# Patient Record
Sex: Female | Born: 1945 | Race: White | Hispanic: No | Marital: Married
Health system: Southern US, Community
[De-identification: ages and names within clinical notes are randomized; demographics above are authoritative.]

---

## 2016-08-26 ENCOUNTER — Other Ambulatory Visit: Payer: Self-pay | Admitting: Otolaryngology

## 2016-08-26 DIAGNOSIS — D37030 Neoplasm of uncertain behavior of the parotid salivary glands: Secondary | ICD-10-CM

## 2017-01-01 ENCOUNTER — Ambulatory Visit
Admission: RE | Admit: 2017-01-01 | Discharge: 2017-01-01 | Disposition: A | Discharge status: Home / Self Care | Payer: Medicare Other | Source: Ambulatory Visit | Attending: Otolaryngology | Admitting: Otolaryngology

## 2017-01-01 DIAGNOSIS — D37030 Neoplasm of uncertain behavior of the parotid salivary glands: Secondary | ICD-10-CM

## 2017-01-01 MED ORDER — IOPAMIDOL (ISOVUE-300) INJECTION 61%
75.0000 mL | Freq: Once | INTRAVENOUS | Status: AC | PRN
  Administered 2017-01-01: 75 mL via INTRAVENOUS

## 2018-05-30 IMAGING — CT CT NECK W/ CM
3 of 6 series · 9 of 20 positions shown, 10 images · IV contrast (75CC ISOVUE 300)
Comparison: None.

CLINICAL DATA: Neoplasm of uncertain behavior of the parotid gland.
Probable nodule anterior to the external auditory canal.

EXAM:
CT NECK WITH CONTRAST
TECHNIQUE: Multidetector CT imaging of the neck was performed using the
standard protocol following the bolus administration of intravenous
contrast.
CONTRAST:  75mL VOLXDJ-644 IOPAMIDOL (VOLXDJ-644) INJECTION 61%

[Series 3: axial neck · axial · 0.49mm/px · z∈[-236,-111]mm · 3 of 100 slices shown]
[im 25/100  bone]
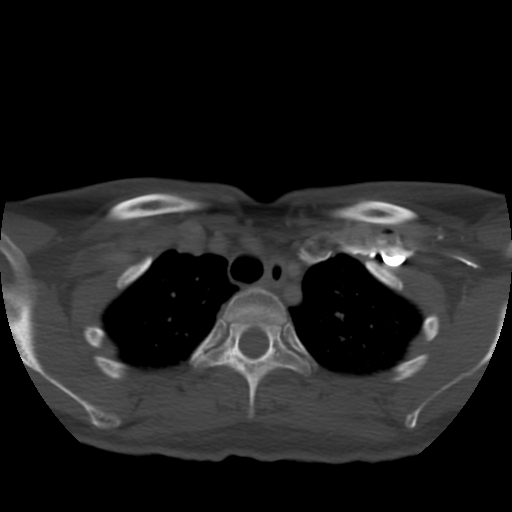
[im 50/100  bone]
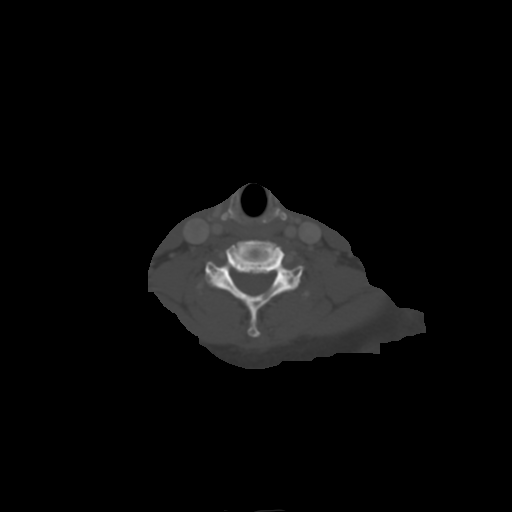
[im 75/100  bone]
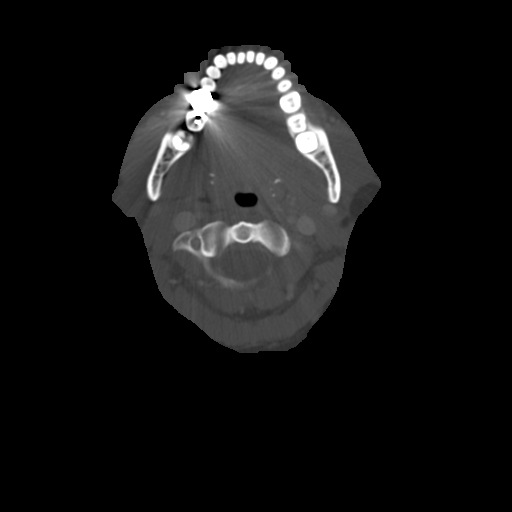

[Series 200: cor · coronal · 0.49mm/px · 3 of 67 slices shown]
[im 27/67  bone]
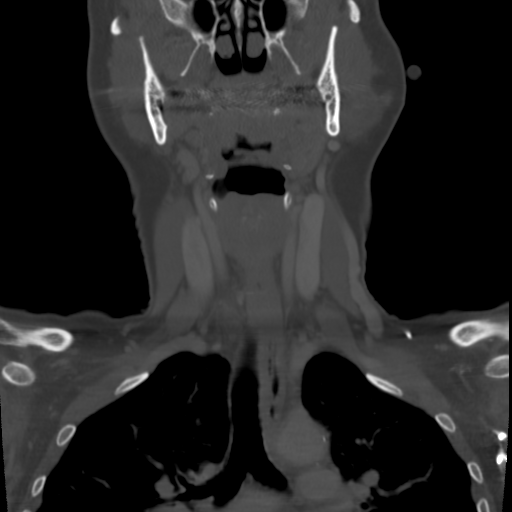
[im 37/67  bone]
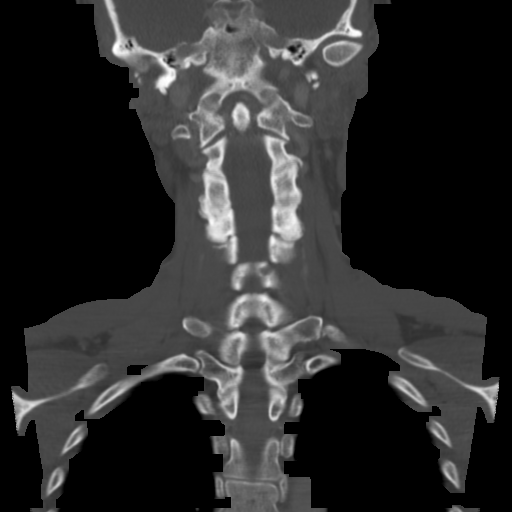
[im 47/67  bone]
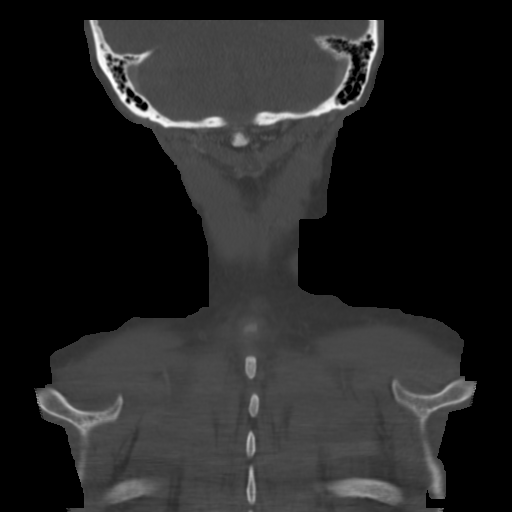

[Series 201: angled axial · axial · 0.49mm/px · z∈[-275,-142]mm · 3 of 92 slices shown, 4 images]
[im 23/92  soft-tissue]
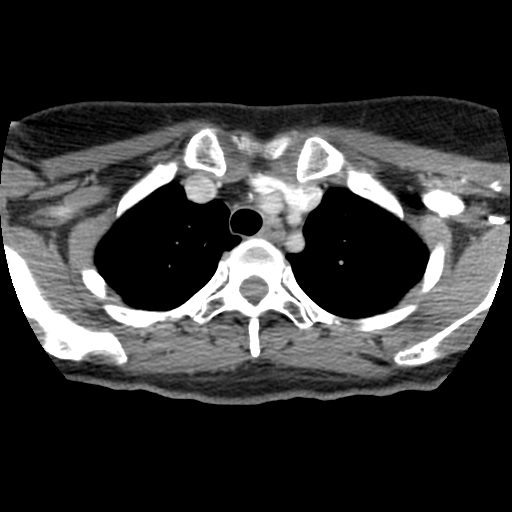
[im 23/92  bone]
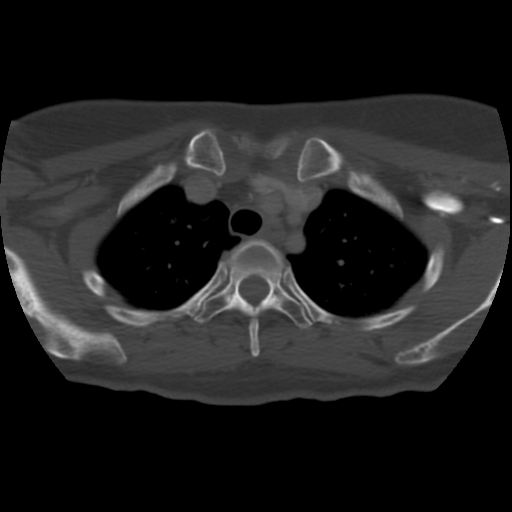
[im 46/92  bone]
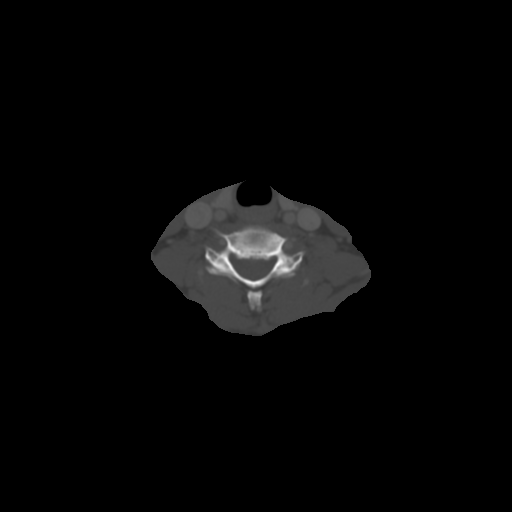
[im 69/92  bone]
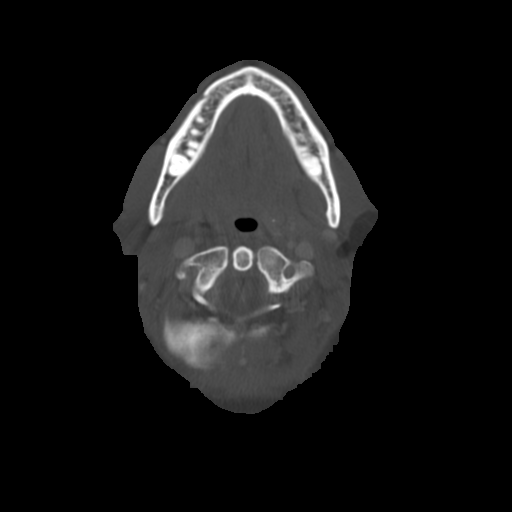

[9 of 20 positions shown; findings below may reference images not displayed]

FINDINGS: Pharynx and larynx: No focal mucosal or submucosal lesions are
present. Calcifications are present within the palatine tonsils
bilaterally. The nasopharynx, oropharynx, and hypopharynx are clear.
Vocal cords are midline and symmetric.

Salivary glands: A 2.4 x 4.0 x 2.5 cm well-defined fat-density
lesion appears to be contained within the superior aspect of the
left parotid gland. There is no discrete enhancing component. The
more inferior parotid gland is normal. The right parotid gland is
within normal limits. The submandibular glands are normal
bilaterally. There is no duct obstruction or stone.

Thyroid: Normal

Lymph nodes: No significant cervical adenopathy is present.

Vascular: No significant vascular lesions are present.

Limited intracranial: Within normal limits.

Visualized orbits: Unremarkable.

Mastoids and visualized paranasal sinuses: The paranasal sinuses and
mastoid air cells are clear.

Skeleton: Mild degenerative changes of the cervical spine are most
pronounced C5-6 with slight retrolisthesis, uncovertebral spurring,
and osseous foraminal narrowing bilaterally. No focal lytic or
blastic lesions are present otherwise.

Upper chest: The lung apices are clear bilaterally. The thoracic
inlet is within normal limits.
IMPRESSION: 1. 4 cm intraparotid lipoma. No significant soft tissue component is
present to suggest malignancy.
2. No significant adenopathy or other focal mass lesion.
3. Focal degenerative changes of the cervical spine at C5-6 with
osseous foraminal narrowing bilaterally.

## 2019-03-28 ENCOUNTER — Ambulatory Visit: Payer: Medicare PPO | Attending: Internal Medicine

## 2019-03-28 DIAGNOSIS — Z23 Encounter for immunization: Secondary | ICD-10-CM | POA: Insufficient documentation

## 2019-03-28 NOTE — Progress Notes (Signed)
   Covid-19 Vaccination Clinic  Name:  Marshelle Merkin    MRN: [id number] DOB: 1945/10/26  03/28/2019  Ms. Contee was observed post Covid-19 immunization for 15 minutes without incidence. She was provided with Vaccine Information Sheet and instruction to access the V-Safe system.   Ms. Weik was instructed to call 911 with any severe reactions post vaccine: Marland Kitchen Difficulty breathing  . Swelling of your face and throat  . A fast heartbeat  . A bad rash all over your body  . Dizziness and weakness    Immunizations Administered    Name Date Dose VIS Date Route   Pfizer COVID-19 Vaccine 03/28/2019  4:48 PM 0.3 mL 01/29/2019 Intramuscular   Manufacturer: [place]   Lot: [id number]   [place]: [phone]

## 2019-04-14 ENCOUNTER — Ambulatory Visit: Payer: Medicare Other

## 2019-04-22 ENCOUNTER — Ambulatory Visit: Payer: Medicare PPO | Attending: Internal Medicine

## 2019-04-22 DIAGNOSIS — Z23 Encounter for immunization: Secondary | ICD-10-CM | POA: Insufficient documentation

## 2019-04-22 NOTE — Progress Notes (Signed)
   Covid-19 Vaccination Clinic  Name:  Chelsea Blackburn    MRN: [id number] DOB: [birth year]  04/22/2019  [name] was observed post Covid-19 immunization for 15 minutes without incident. She was provided with Vaccine Information Sheet and instruction to access the V-Safe system.   [name] was instructed to call 911 with any severe reactions post vaccine: Marland Kitchen Difficulty breathing  . Swelling of face and throat  . A fast heartbeat  . A bad rash all over body  . Dizziness and weakness   Immunizations Administered    Name Date Dose VIS Date Route   Pfizer COVID-19 Vaccine 04/22/2019 12:45 PM 0.3 mL 01/29/2019 Intramuscular   Manufacturer: [place]   Lot: [id number]   [place]: [id number]

## 2019-12-18 ENCOUNTER — Ambulatory Visit: Payer: Medicare PPO | Attending: Internal Medicine

## 2019-12-18 DIAGNOSIS — Z23 Encounter for immunization: Secondary | ICD-10-CM

## 2019-12-18 NOTE — Progress Notes (Signed)
   Covid-19 Vaccination Clinic  Name:  Chelsea Blackburn    MRN: [id number] DOB: [birth year]  12/18/2019  Ms. Dome was observed post Covid-19 immunization for 15 minutes without incident. She was provided with Vaccine Information Sheet and instruction to access the V-Safe system.   [name] was instructed to call 911 with any severe reactions post vaccine: Marland Kitchen Difficulty breathing  . Swelling of face and throat  . A fast heartbeat  . A bad rash all over body  . Dizziness and weakness

## 2020-01-26 DIAGNOSIS — Z1231 Encounter for screening mammogram for malignant neoplasm of breast: Secondary | ICD-10-CM | POA: Diagnosis not present

## 2020-07-26 DIAGNOSIS — N951 Menopausal and female climacteric states: Secondary | ICD-10-CM | POA: Diagnosis not present

## 2020-07-26 DIAGNOSIS — C4491 Basal cell carcinoma of skin, unspecified: Secondary | ICD-10-CM | POA: Diagnosis not present

## 2020-07-26 DIAGNOSIS — Z1389 Encounter for screening for other disorder: Secondary | ICD-10-CM | POA: Diagnosis not present

## 2020-07-26 DIAGNOSIS — Z0000 Encounter for general adult medical examination without abnormal findings: Secondary | ICD-10-CM | POA: Diagnosis not present

## 2020-07-26 DIAGNOSIS — M810 Age-related osteoporosis without current pathological fracture: Secondary | ICD-10-CM | POA: Diagnosis not present

## 2020-07-26 DIAGNOSIS — Z131 Encounter for screening for diabetes mellitus: Secondary | ICD-10-CM | POA: Diagnosis not present

## 2021-01-31 DIAGNOSIS — Z1231 Encounter for screening mammogram for malignant neoplasm of breast: Secondary | ICD-10-CM | POA: Diagnosis not present

## 2021-03-30 DIAGNOSIS — L661 Lichen planopilaris: Secondary | ICD-10-CM | POA: Diagnosis not present

## 2021-03-30 DIAGNOSIS — L308 Other specified dermatitis: Secondary | ICD-10-CM | POA: Diagnosis not present

## 2021-03-30 DIAGNOSIS — L814 Other melanin hyperpigmentation: Secondary | ICD-10-CM | POA: Diagnosis not present

## 2021-03-30 DIAGNOSIS — D1801 Hemangioma of skin and subcutaneous tissue: Secondary | ICD-10-CM | POA: Diagnosis not present

## 2021-03-30 DIAGNOSIS — L821 Other seborrheic keratosis: Secondary | ICD-10-CM | POA: Diagnosis not present

## 2021-05-30 DIAGNOSIS — L661 Lichen planopilaris: Secondary | ICD-10-CM | POA: Diagnosis not present

## 2021-08-08 DIAGNOSIS — Z131 Encounter for screening for diabetes mellitus: Secondary | ICD-10-CM | POA: Diagnosis not present

## 2021-08-08 DIAGNOSIS — Z0000 Encounter for general adult medical examination without abnormal findings: Secondary | ICD-10-CM | POA: Diagnosis not present

## 2021-08-08 DIAGNOSIS — M810 Age-related osteoporosis without current pathological fracture: Secondary | ICD-10-CM | POA: Diagnosis not present

## 2021-08-27 DIAGNOSIS — L661 Lichen planopilaris: Secondary | ICD-10-CM | POA: Diagnosis not present

## 2021-11-28 DIAGNOSIS — H52203 Unspecified astigmatism, bilateral: Secondary | ICD-10-CM | POA: Diagnosis not present

## 2021-11-28 DIAGNOSIS — H2513 Age-related nuclear cataract, bilateral: Secondary | ICD-10-CM | POA: Diagnosis not present

## 2021-11-29 DIAGNOSIS — L661 Lichen planopilaris: Secondary | ICD-10-CM | POA: Diagnosis not present

## 2022-01-02 DIAGNOSIS — L509 Urticaria, unspecified: Secondary | ICD-10-CM | POA: Diagnosis not present

## 2022-02-04 DIAGNOSIS — Z1231 Encounter for screening mammogram for malignant neoplasm of breast: Secondary | ICD-10-CM | POA: Diagnosis not present

## 2022-06-26 DIAGNOSIS — R051 Acute cough: Secondary | ICD-10-CM | POA: Diagnosis not present

## 2022-06-28 DIAGNOSIS — D490 Neoplasm of unspecified behavior of digestive system: Secondary | ICD-10-CM | POA: Diagnosis not present

## 2022-06-28 DIAGNOSIS — M952 Other acquired deformity of head: Secondary | ICD-10-CM | POA: Diagnosis not present

## 2022-07-01 ENCOUNTER — Other Ambulatory Visit (HOSPITAL_COMMUNITY): Payer: Self-pay | Admitting: Otolaryngology

## 2022-07-01 DIAGNOSIS — D490 Neoplasm of unspecified behavior of digestive system: Secondary | ICD-10-CM

## 2022-07-01 DIAGNOSIS — M952 Other acquired deformity of head: Secondary | ICD-10-CM

## 2022-07-05 NOTE — Progress Notes (Signed)
I called Dr Harvie Junior office and left a voicemail for the referral coord. Advising of IR revewi below & the need for a CT Neck w/ contrast   Mir, Al Corpus, MD  Ricke Hey,  I spoke to Dr. Ernestene Kiel about this case.  The prior CT from 2018 doesn't really show a good target within the lipoma.  She is supposed to have a repeat CT scan of the neck.  Do you see one scheduled yet?  If not, please let me know.  If she has been scheduled, please put the request back in after the CT has been completed.  Dr. Ernestene Kiel said if there is something suspicious on the new CT, we can target it for biopsy.  If there isn't anything suspicious, then cancel the biopy.  Thanks  Mir

## 2022-07-08 ENCOUNTER — Other Ambulatory Visit: Payer: Self-pay | Admitting: Otolaryngology

## 2022-07-08 DIAGNOSIS — M952 Other acquired deformity of head: Secondary | ICD-10-CM

## 2022-07-08 DIAGNOSIS — D490 Neoplasm of unspecified behavior of digestive system: Secondary | ICD-10-CM

## 2022-08-02 ENCOUNTER — Encounter: Payer: Self-pay | Admitting: Otolaryngology

## 2022-08-08 ENCOUNTER — Ambulatory Visit
Admission: RE | Admit: 2022-08-08 | Discharge: 2022-08-08 | Disposition: A | Discharge status: Home / Self Care | Payer: Medicare PPO | Source: Ambulatory Visit | Attending: Otolaryngology | Admitting: Otolaryngology

## 2022-08-08 DIAGNOSIS — M952 Other acquired deformity of head: Secondary | ICD-10-CM

## 2022-08-08 DIAGNOSIS — D490 Neoplasm of unspecified behavior of digestive system: Secondary | ICD-10-CM

## 2022-08-08 DIAGNOSIS — D110 Benign neoplasm of parotid gland: Secondary | ICD-10-CM | POA: Diagnosis not present

## 2022-08-08 MED ORDER — IOPAMIDOL (ISOVUE-300) INJECTION 61%
75.0000 mL | Freq: Once | INTRAVENOUS | Status: AC | PRN
  Administered 2022-08-08: 75 mL via INTRAVENOUS

## 2022-08-12 DIAGNOSIS — Z13220 Encounter for screening for lipoid disorders: Secondary | ICD-10-CM | POA: Diagnosis not present

## 2022-08-12 DIAGNOSIS — L659 Nonscarring hair loss, unspecified: Secondary | ICD-10-CM | POA: Diagnosis not present

## 2022-08-12 DIAGNOSIS — M810 Age-related osteoporosis without current pathological fracture: Secondary | ICD-10-CM | POA: Diagnosis not present

## 2022-08-12 DIAGNOSIS — Z136 Encounter for screening for cardiovascular disorders: Secondary | ICD-10-CM | POA: Diagnosis not present

## 2022-08-12 DIAGNOSIS — R220 Localized swelling, mass and lump, head: Secondary | ICD-10-CM | POA: Diagnosis not present

## 2022-08-12 DIAGNOSIS — Z0000 Encounter for general adult medical examination without abnormal findings: Secondary | ICD-10-CM | POA: Diagnosis not present

## 2022-08-16 NOTE — Progress Notes (Signed)
Oley Balm, MD  Leodis Rains D CT shows fat-attenuation lipoma without soft tissue component or other worrisome features. If liposarc is a clinical concern a core biopsy will  not be able to rule that out. thx Oley Balm IR

## 2022-08-20 DIAGNOSIS — M952 Other acquired deformity of head: Secondary | ICD-10-CM | POA: Diagnosis not present

## 2022-08-20 DIAGNOSIS — D490 Neoplasm of unspecified behavior of digestive system: Secondary | ICD-10-CM | POA: Diagnosis not present

## 2022-08-21 ENCOUNTER — Other Ambulatory Visit: Payer: Self-pay | Admitting: Otolaryngology

## 2022-08-21 DIAGNOSIS — M952 Other acquired deformity of head: Secondary | ICD-10-CM

## 2022-08-21 DIAGNOSIS — D490 Neoplasm of unspecified behavior of digestive system: Secondary | ICD-10-CM

## 2022-08-21 NOTE — Progress Notes (Signed)
Scarlette Ar, MD  Leodis Rains D Yes       Previous Messages    ----- Message ----- From: Leodis Rains D Sent: 08/21/2022   5:47 PM EDT To: Scarlette Ar, MD Subject: RE: BX request                                Ok So we can cancel this request at this time?  ----- Message ----- From: Scarlette Ar, MD Sent: 08/21/2022   5:21 PM EDT To: Dorise Hiss Subject: RE: BX request                                No need for biopsy ----- Message ----- From: Leodis Rains D Sent: 08/21/2022   5:20 PM EDT To: Scarlette Ar, MD Subject: BX request                                    This is what IR sent back regarding the BX request.  Oley Balm, MD  Leodis Rains D CT shows fat-attenuation lipoma without soft tissue component or other worrisome features. If liposarc is a clinical concern a core biopsy will  not be able to rule that out. thx Oley Balm IR

## 2023-02-14 DIAGNOSIS — H0014 Chalazion left upper eyelid: Secondary | ICD-10-CM | POA: Diagnosis not present

## 2023-02-14 DIAGNOSIS — H5203 Hypermetropia, bilateral: Secondary | ICD-10-CM | POA: Diagnosis not present

## 2023-02-14 DIAGNOSIS — H2513 Age-related nuclear cataract, bilateral: Secondary | ICD-10-CM | POA: Diagnosis not present

## 2023-03-19 DIAGNOSIS — M8588 Other specified disorders of bone density and structure, other site: Secondary | ICD-10-CM | POA: Diagnosis not present

## 2023-03-19 DIAGNOSIS — Z1231 Encounter for screening mammogram for malignant neoplasm of breast: Secondary | ICD-10-CM | POA: Diagnosis not present

## 2023-05-01 DIAGNOSIS — Z85828 Personal history of other malignant neoplasm of skin: Secondary | ICD-10-CM | POA: Diagnosis not present

## 2023-05-01 DIAGNOSIS — D1801 Hemangioma of skin and subcutaneous tissue: Secondary | ICD-10-CM | POA: Diagnosis not present

## 2023-05-01 DIAGNOSIS — L821 Other seborrheic keratosis: Secondary | ICD-10-CM | POA: Diagnosis not present

## 2023-05-01 DIAGNOSIS — L6611 Classic lichen planopilaris: Secondary | ICD-10-CM | POA: Diagnosis not present

## 2023-05-01 DIAGNOSIS — C44719 Basal cell carcinoma of skin of left lower limb, including hip: Secondary | ICD-10-CM | POA: Diagnosis not present

## 2023-08-29 DIAGNOSIS — Z13220 Encounter for screening for lipoid disorders: Secondary | ICD-10-CM | POA: Diagnosis not present

## 2023-08-29 DIAGNOSIS — L659 Nonscarring hair loss, unspecified: Secondary | ICD-10-CM | POA: Diagnosis not present

## 2023-08-29 DIAGNOSIS — M810 Age-related osteoporosis without current pathological fracture: Secondary | ICD-10-CM | POA: Diagnosis not present

## 2023-08-29 DIAGNOSIS — R220 Localized swelling, mass and lump, head: Secondary | ICD-10-CM | POA: Diagnosis not present

## 2023-08-29 DIAGNOSIS — Z0000 Encounter for general adult medical examination without abnormal findings: Secondary | ICD-10-CM | POA: Diagnosis not present

## 2023-08-29 DIAGNOSIS — Z136 Encounter for screening for cardiovascular disorders: Secondary | ICD-10-CM | POA: Diagnosis not present

## 2023-08-29 DIAGNOSIS — Z130 Encounter for screening for diseases of the blood and blood-forming organs and certain disorders involving the immune mechanism: Secondary | ICD-10-CM | POA: Diagnosis not present
